# Patient Record
Sex: Male | Born: 1935 | Race: White | Hispanic: No | Marital: Married | Smoking: Never smoker
Health system: Southern US, Community
[De-identification: ages and names within clinical notes are randomized; demographics above are authoritative.]

## PROBLEM LIST (undated history)

## (undated) DIAGNOSIS — F0390 Unspecified dementia without behavioral disturbance: Secondary | ICD-10-CM

## (undated) DIAGNOSIS — C61 Malignant neoplasm of prostate: Secondary | ICD-10-CM

## (undated) HISTORY — PX: HIP SURGERY: SHX245

---

## 2013-10-26 ENCOUNTER — Emergency Department (HOSPITAL_COMMUNITY)
Admission: EM | Admit: 2013-10-26 | Discharge: 2013-10-26 | Disposition: A | Discharge status: Home / Self Care | Payer: Medicare Other | Attending: Emergency Medicine | Admitting: Emergency Medicine

## 2013-10-26 ENCOUNTER — Emergency Department (HOSPITAL_COMMUNITY): Payer: Medicare Other

## 2013-10-26 ENCOUNTER — Encounter (HOSPITAL_COMMUNITY): Payer: Self-pay | Admitting: Emergency Medicine

## 2013-10-26 DIAGNOSIS — Y929 Unspecified place or not applicable: Secondary | ICD-10-CM | POA: Insufficient documentation

## 2013-10-26 DIAGNOSIS — Y9301 Activity, walking, marching and hiking: Secondary | ICD-10-CM | POA: Insufficient documentation

## 2013-10-26 DIAGNOSIS — S0100XA Unspecified open wound of scalp, initial encounter: Secondary | ICD-10-CM | POA: Insufficient documentation

## 2013-10-26 DIAGNOSIS — W19XXXA Unspecified fall, initial encounter: Secondary | ICD-10-CM

## 2013-10-26 DIAGNOSIS — Z8546 Personal history of malignant neoplasm of prostate: Secondary | ICD-10-CM | POA: Insufficient documentation

## 2013-10-26 DIAGNOSIS — F0390 Unspecified dementia without behavioral disturbance: Secondary | ICD-10-CM | POA: Insufficient documentation

## 2013-10-26 DIAGNOSIS — S0990XA Unspecified injury of head, initial encounter: Secondary | ICD-10-CM | POA: Insufficient documentation

## 2013-10-26 DIAGNOSIS — Z23 Encounter for immunization: Secondary | ICD-10-CM | POA: Insufficient documentation

## 2013-10-26 DIAGNOSIS — W010XXA Fall on same level from slipping, tripping and stumbling without subsequent striking against object, initial encounter: Secondary | ICD-10-CM | POA: Insufficient documentation

## 2013-10-26 DIAGNOSIS — S0101XA Laceration without foreign body of scalp, initial encounter: Secondary | ICD-10-CM

## 2013-10-26 HISTORY — DX: Malignant neoplasm of prostate: C61

## 2013-10-26 HISTORY — DX: Unspecified dementia, unspecified severity, without behavioral disturbance, psychotic disturbance, mood disturbance, and anxiety: F03.90

## 2013-10-26 MED ORDER — TETANUS-DIPHTH-ACELL PERTUSSIS 5-2.5-18.5 LF-MCG/0.5 IM SUSP
0.5000 mL | Freq: Once | INTRAMUSCULAR | Status: AC
  Administered 2013-10-26: 0.5 mL via INTRAMUSCULAR
  Filled 2013-10-26: qty 0.5

## 2013-10-26 NOTE — Discharge Instructions (Signed)
Head Injury, Adult You have a head injury. Headaches and throwing up (vomiting) are common after a head injury. It should be easy to wake up from sleeping. Sometimes you must stay in the hospital. Most problems happen within the first 24 hours. Side effects may occur up to 7 10 days after the injury.  WHAT ARE THE TYPES OF HEAD INJURIES? Head injuries can be as minor as a bump. Some head injuries can be more severe. More severe head injuries include:  A jarring injury to the brain (concussion).  A bruise of the brain (contusion). This mean there is bleeding in the brain that can cause swelling.  A cracked skull (skull fracture).  Bleeding in the brain that collects, clots, and forms a bump (hematoma). . WHEN SHOULD I GET HELP RIGHT AWAY?   You are confused or sleepy.  You cannot be woken up.  You feel sick to your stomach (nauseous) or keep throwing up.  Your dizziness or unsteadiness is get worse.  You have very bad, lasting headaches that are not helped by medicine.  You cannot use your arms or legs like normal  You cannot walk.  You notice changes in the black spots in the center of the colored part of your eye (pupil).  You have clear or bloody fluid coming from your nose or ears.  You have trouble seeing. During the next 24 hours after the injury, you must stay with someone who can watch you. This person should get help right away (call 911 in the [place] if you start to shake and are not able to control it (seizures), you become pass out, or you are unable to wake up. HOW CAN I PREVENT A HEAD INJURY IN THE FUTURE?  Wear seat belts.  Wear helmets while bike riding and playing sports like football.  Stay away from dangerous activities around the house. WHEN CAN I RETURN TO NORMAL ACTIVITIES AND ATHLETICS? See your doctor before doing these activities. You should not do normal activities or play contact sports until 1 week after the following symptoms have  stopped:  Headache that does not go away.  Dizziness.  Poor attention.  Confusion.  Memory problems.  Sickness to your stomach or throwing up.  Tiredness.  Fussiness.  Bothered by bright lights or loud noises.  Anxiousness or depression.  Restless sleep. MAKE SURE YOU:   Understand these instructions.  Will watch your condition.  Will get help right away if you are not doing well or get worse. Document Released: 07/11/2008 Document Revised: 05/19/2013 Document Reviewed: 04/05/2013 [facility] Patient Information 2014 [place].  Laceration Care, Adult A laceration is a cut that goes through all layers of the skin. The cut goes into the tissue beneath the skin. HOME CARE For stitches (sutures) or staples:  Keep the cut clean and dry.  If you have a bandage (dressing), change it at least once a day. Change the bandage if it gets wet or dirty, or as told by your doctor.  Wash the cut with soap and water 2 times a day. Rinse the cut with water. Pat it dry with a clean towel.  Put a thin layer of medicated cream on the cut as told by your doctor.  You may shower after the first 24 hours. Do not soak the cut in water until the stitches are removed.  Only take medicines as told by your doctor.  Have your stitches or staples removed as told by your doctor. For skin adhesive strips:  Keep the cut clean and dry.  Do not get the strips wet. You may take a bath, but be careful to keep the cut dry.  If the cut gets wet, pat it dry with a clean towel.  The strips will fall off on their own. Do not remove the strips that are still stuck to the cut. For wound glue:  You may shower or take baths. Do not soak or scrub the cut. Do not swim. Avoid heavy sweating until the glue falls off on its own. After a shower or bath, pat the cut dry with a clean towel.  Do not put medicine on your cut until the glue falls off.  If you have a bandage, do not put tape over the  glue.  Avoid lots of sunlight or tanning lamps until the glue falls off. Put sunscreen on the cut for the first year to reduce your scar.  The glue will fall off on its own. Do not pick at the glue. You may need a tetanus shot if:  You cannot remember when you had your last tetanus shot.  You have never had a tetanus shot. If you need a tetanus shot and you choose not to have one, you may get tetanus. Sickness from tetanus can be serious. GET HELP RIGHT AWAY IF:   Your pain does not get better with medicine.  Your arm, hand, leg, or foot loses feeling (numbness) or changes color.  Your cut is bleeding.  Your joint feels weak, or you cannot use your joint.  You have painful lumps on your body.  Your cut is red, puffy (swollen), or painful.  You have a red line on the skin near the cut.  You have yellowish-white fluid (pus) coming from the cut.  You have a fever.  You have a bad smell coming from the cut or bandage.  Your cut breaks open before or after stitches are removed.  You notice something coming out of the cut, such as wood or glass.  You cannot move a finger or toe. MAKE SURE YOU:   Understand these instructions.  Will watch your condition.  Will get help right away if you are not doing well or get worse. Document Released: 01/15/2008 Document Revised: 10/21/2011 Document Reviewed: 01/22/2011 [facility] Patient Information 2014 [place].  Staple Wound Closure Staples are used to help a wound heal faster by holding the edges of the wound together. HOME CARE  Keep the area around the staples clean and dry.  Rest and raise (elevate) the injured part above the level of your heart.  See your doctor for a follow-up check of the wound.  See your doctor to have the staples removed.  Clean the wound daily with water.  Do not soak the wound in water for long periods of time.  Let air reach the wound as it heals. GET HELP RIGHT AWAY IF:   You have  redness or puffiness around the wound.  You have a red line going away from the wound.  You have more pain or tenderness.  You have yellowish-white fluid (pus) coming from the wound.  Your wound does not stay together after the staples have been taken out.  You see something coming out of the wound, such as wood or glass.  You have problems moving the injured area.  You have a fever or lasting symptoms for more than 2-3 days.  You have a fever and your symptoms suddenly get worse. MAKE SURE YOU:  Understand these instructions.  Will watch this condition.  Will get help right away if you are not doing well or get worse. Document Released: 05/07/2008 Document Revised: 04/22/2012 Document Reviewed: 02/09/2012 [facility] Patient Information 2014 [place].

## 2013-10-26 NOTE — ED Provider Notes (Signed)
CSN: [id number]     Arrival date & time 10/26/13  1930 History   First MD Initiated Contact with Patient 10/26/13 1951     Chief Complaint  Patient presents with  . Head Laceration     (Consider location/radiation/quality/duration/timing/severity/associated sxs/prior Treatment) Patient is a 80 y.o. male presenting with scalp laceration. The history is provided by the patient and a relative.  Head Laceration   The patient was seen walking with his walker, when he suddenly tumbled backwards, falling and struck his head on a cabinet. He injured his head in the fall. There was no loss of consciousness. Several hours later, his granddaughter arrived and thought that he should, here for evaluation. He came by private vehicle. He has not complained of any other injuries. There's been no vomiting, headache, chest pain, or noted weakness. The patient is unable to contribute to history.  Level V caveat- dementia  Past Medical History  Diagnosis Date  . Dementia   . Prostate cancer    Past Surgical History  Procedure Laterality Date  . Hip surgery     History reviewed. No pertinent family history. History  Substance Use Topics  . Smoking status: Never Smoker   . Smokeless tobacco: Not on file  . Alcohol Use: No    Review of Systems  All other systems reviewed and are negative.      Allergies  Review of patient's allergies indicates no known allergies.  Home Medications  No current outpatient prescriptions on file. BP 116/68  Pulse 93  Temp(Src) 97.6 F (36.4 C) (Oral)  Resp 18  Ht 6\' 1"  (1.854 m)  Wt 160 lb (72.576 kg)  BMI 21.11 kg/m2  SpO2 95% Physical Exam  Nursing note and vitals reviewed. Constitutional: He appears well-developed. No distress.  Elderly, frail. Cooperates with examination  HENT:  Head: Normocephalic and atraumatic.  Right Ear: External ear normal.  Left Ear: External ear normal.  Vertical laceration, left parietal without associated  crepitation, deformity or active bleeding.  Eyes: Conjunctivae and EOM are normal. Pupils are equal, round, and reactive to light.  Neck: Normal range of motion and phonation normal. Neck supple.  Cardiovascular: Normal rate, regular rhythm, normal heart sounds and intact distal pulses.   Pulmonary/Chest: Effort normal and breath sounds normal. He exhibits no bony tenderness.  No chest wall tenderness, crepitation, or deformity  Abdominal: Soft. Normal appearance. There is no tenderness.  Musculoskeletal: Normal range of motion.  No tenderness to palpation of the cervical, thoracic, or lumbar spines.  Neurological: He is alert. No cranial nerve deficit or sensory deficit. He exhibits normal muscle tone. Coordination normal.  Skin: Skin is warm, dry and intact.  Psychiatric: He has a normal mood and affect. His behavior is normal.    ED Course  Procedures (including critical care time)  Medications  Tdap (BOOSTRIX) injection 0.5 mL (0.5 mLs Intramuscular Given 10/26/13 2019)    Patient Vitals for the past 24 hrs:  BP Temp Temp src Pulse Resp SpO2 Height Weight  10/26/13 1940 116/68 mmHg 97.6 F (36.4 C) Oral 93 18 95 % 6\' 1"  (1.854 m) 160 lb (72.576 kg)   LACERATION REPAIR Performed by: Richarda Blade Consent: Verbal consent obtained. Risks and benefits: risks, benefits and alternatives were discussed Patient identity confirmed: provided demographic data Time out performed prior to procedure Prepped and Draped in normal sterile fashion Wound explored Laceration Location: left parietal Laceration Length: 3.5cm No Foreign Bodies seen or palpated Anesthesia: local infiltration  Irrigation method: Saline  on gauze Amount of cleaning: standard Skin closure: Staples Number of sutures or staples: 3 Technique: regular Patient tolerance: Patient tolerated the procedure well with no immediate complications.   8:10 PM Reevaluation with update and discussion. After initial assessment  and treatment, an updated evaluation reveals No change in PE. Tolerated wound closure well. Findings discussed with son, questions answered. Takuya Lariccia L      MDM   Final diagnoses:  Fall  Laceration of scalp  Head injury    Fall related to gait instability. There is no apparent causative factor for the fall, such as acute illness, focal neurologic weakness, or suspected orthostasis.  Nursing Notes Reviewed/ Care Coordinated Applicable Imaging Reviewed Interpretation of Laboratory Data incorporated into ED treatment  The patient appears reasonably screened and/or stabilized for discharge and I doubt any other medical condition or other [facility] requiring further screening, evaluation, or treatment in the ED at this time prior to discharge.  Plan: Home Medications- usual; Home Treatments- rest, wound care; return here if the recommended treatment, does not improve the symptoms; Recommended follow up- Staples out 7-10 days, return here prn     Richarda Blade, MD 10/26/13 2142

## 2013-10-26 NOTE — ED Notes (Signed)
Patient states he was ambulating with his walker today about 1500 and "I got a little dizzy and fell." Patient has approximately 1" laceration to back of head. Denies LOC. Bleeding controlled at this time.

## 2014-03-25 ENCOUNTER — Other Ambulatory Visit (HOSPITAL_COMMUNITY): Payer: Self-pay | Admitting: Urology

## 2014-03-25 DIAGNOSIS — C61 Malignant neoplasm of prostate: Secondary | ICD-10-CM

## 2014-04-01 ENCOUNTER — Encounter (HOSPITAL_COMMUNITY): Payer: Self-pay

## 2014-04-01 ENCOUNTER — Encounter (HOSPITAL_COMMUNITY)
Admission: RE | Admit: 2014-04-01 | Discharge: 2014-04-01 | Disposition: A | Discharge status: Home / Self Care | Payer: Medicare Other | Source: Ambulatory Visit | Attending: Urology | Admitting: Urology

## 2014-04-01 DIAGNOSIS — C61 Malignant neoplasm of prostate: Secondary | ICD-10-CM | POA: Insufficient documentation

## 2014-04-01 MED ORDER — TECHNETIUM TC 99M MEDRONATE IV KIT
25.0000 | PACK | Freq: Once | INTRAVENOUS | Status: AC | PRN
  Administered 2014-04-01: 25 via INTRAVENOUS

## 2014-04-19 ENCOUNTER — Ambulatory Visit (HOSPITAL_COMMUNITY)
Admission: RE | Admit: 2014-04-19 | Discharge: 2014-04-19 | Disposition: A | Discharge status: Home / Self Care | Payer: Medicare Other | Source: Ambulatory Visit | Attending: Urology | Admitting: Urology

## 2014-04-19 ENCOUNTER — Other Ambulatory Visit: Payer: Self-pay | Admitting: Urology

## 2014-04-19 ENCOUNTER — Ambulatory Visit (INDEPENDENT_AMBULATORY_CARE_PROVIDER_SITE_OTHER): Payer: Medicare Other | Admitting: Urology

## 2014-04-19 DIAGNOSIS — R05 Cough: Secondary | ICD-10-CM | POA: Diagnosis not present

## 2014-04-19 DIAGNOSIS — C7952 Secondary malignant neoplasm of bone marrow: Secondary | ICD-10-CM

## 2014-04-19 DIAGNOSIS — R0602 Shortness of breath: Secondary | ICD-10-CM | POA: Insufficient documentation

## 2014-04-19 DIAGNOSIS — C7951 Secondary malignant neoplasm of bone: Secondary | ICD-10-CM

## 2014-04-19 DIAGNOSIS — R059 Cough, unspecified: Secondary | ICD-10-CM | POA: Diagnosis not present

## 2014-04-19 DIAGNOSIS — R918 Other nonspecific abnormal finding of lung field: Secondary | ICD-10-CM | POA: Diagnosis not present

## 2014-04-19 DIAGNOSIS — C61 Malignant neoplasm of prostate: Secondary | ICD-10-CM

## 2014-08-12 DEATH — deceased

## 2015-09-16 IMAGING — CR DG CHEST 2V
2 series · 2 of 2 positions shown · non-contrast
Comparison: None.

CLINICAL DATA: Shortness of breath and cough.

EXAM:
CHEST  2 VIEW

[view not recorded (1 of 2)]
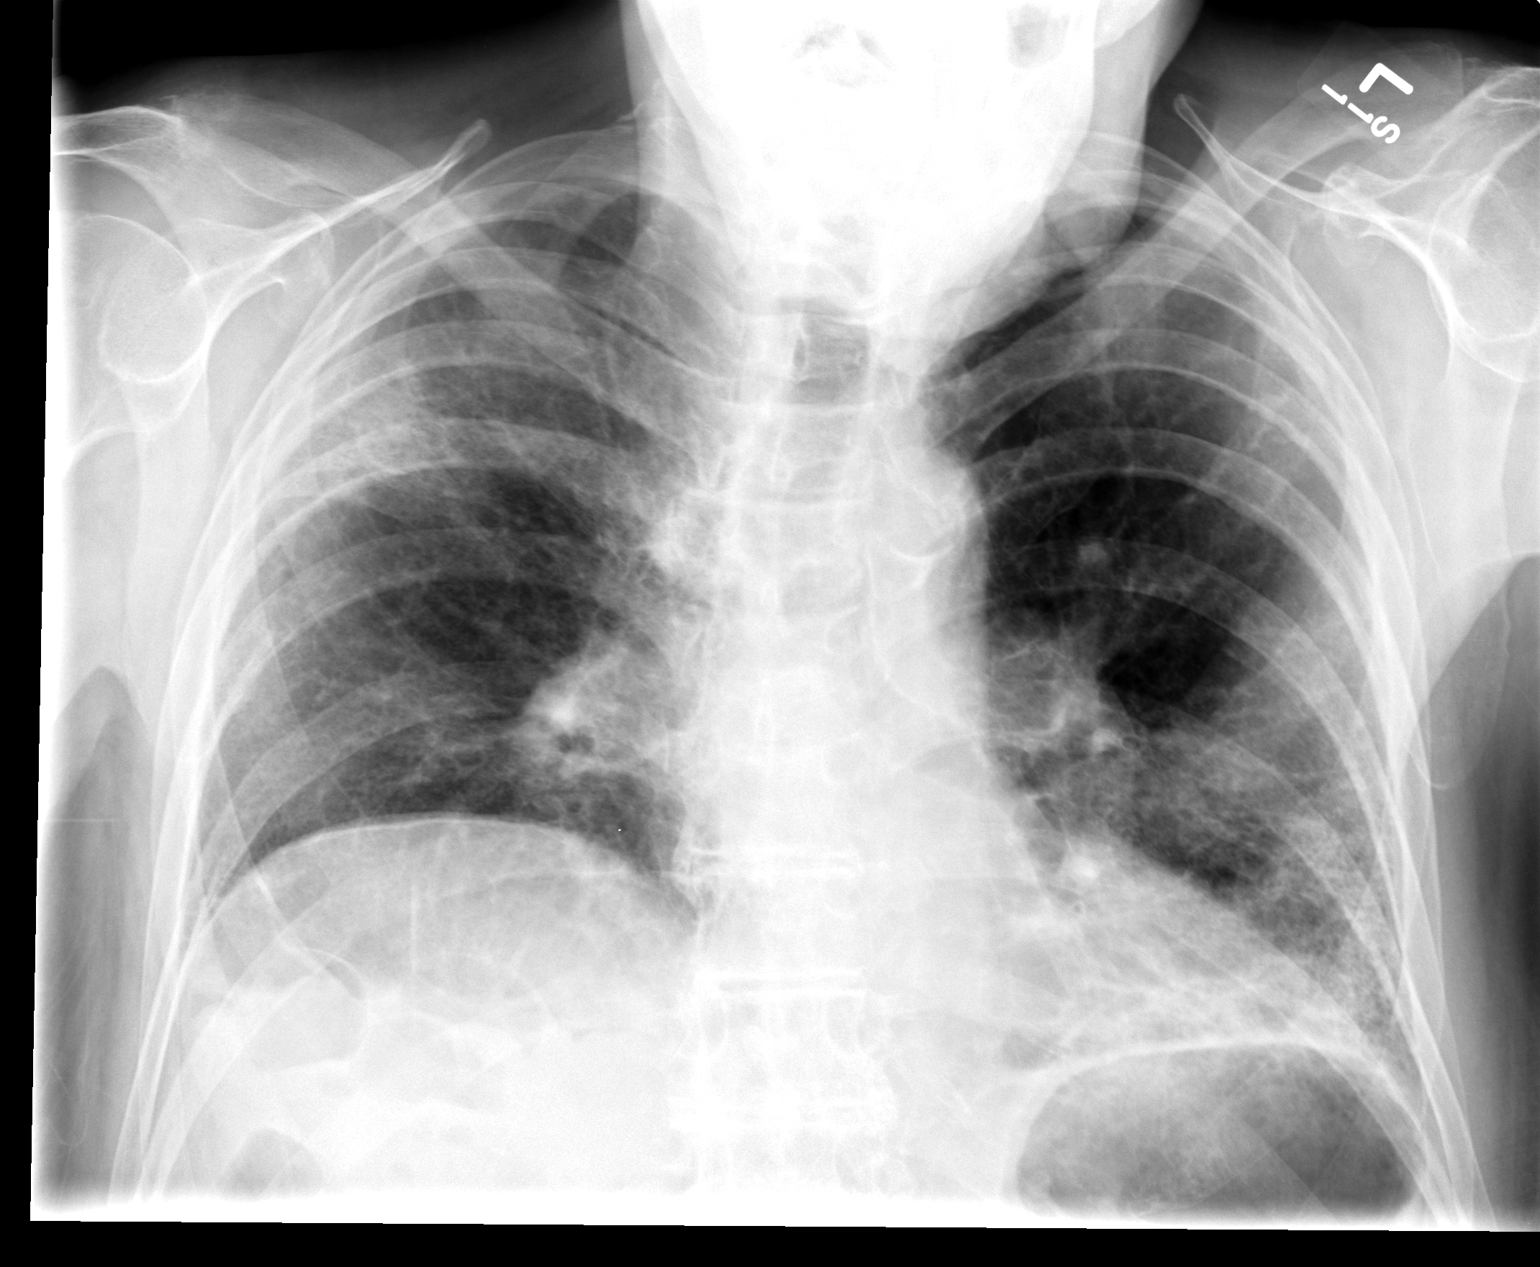

[view not recorded (2 of 2)]
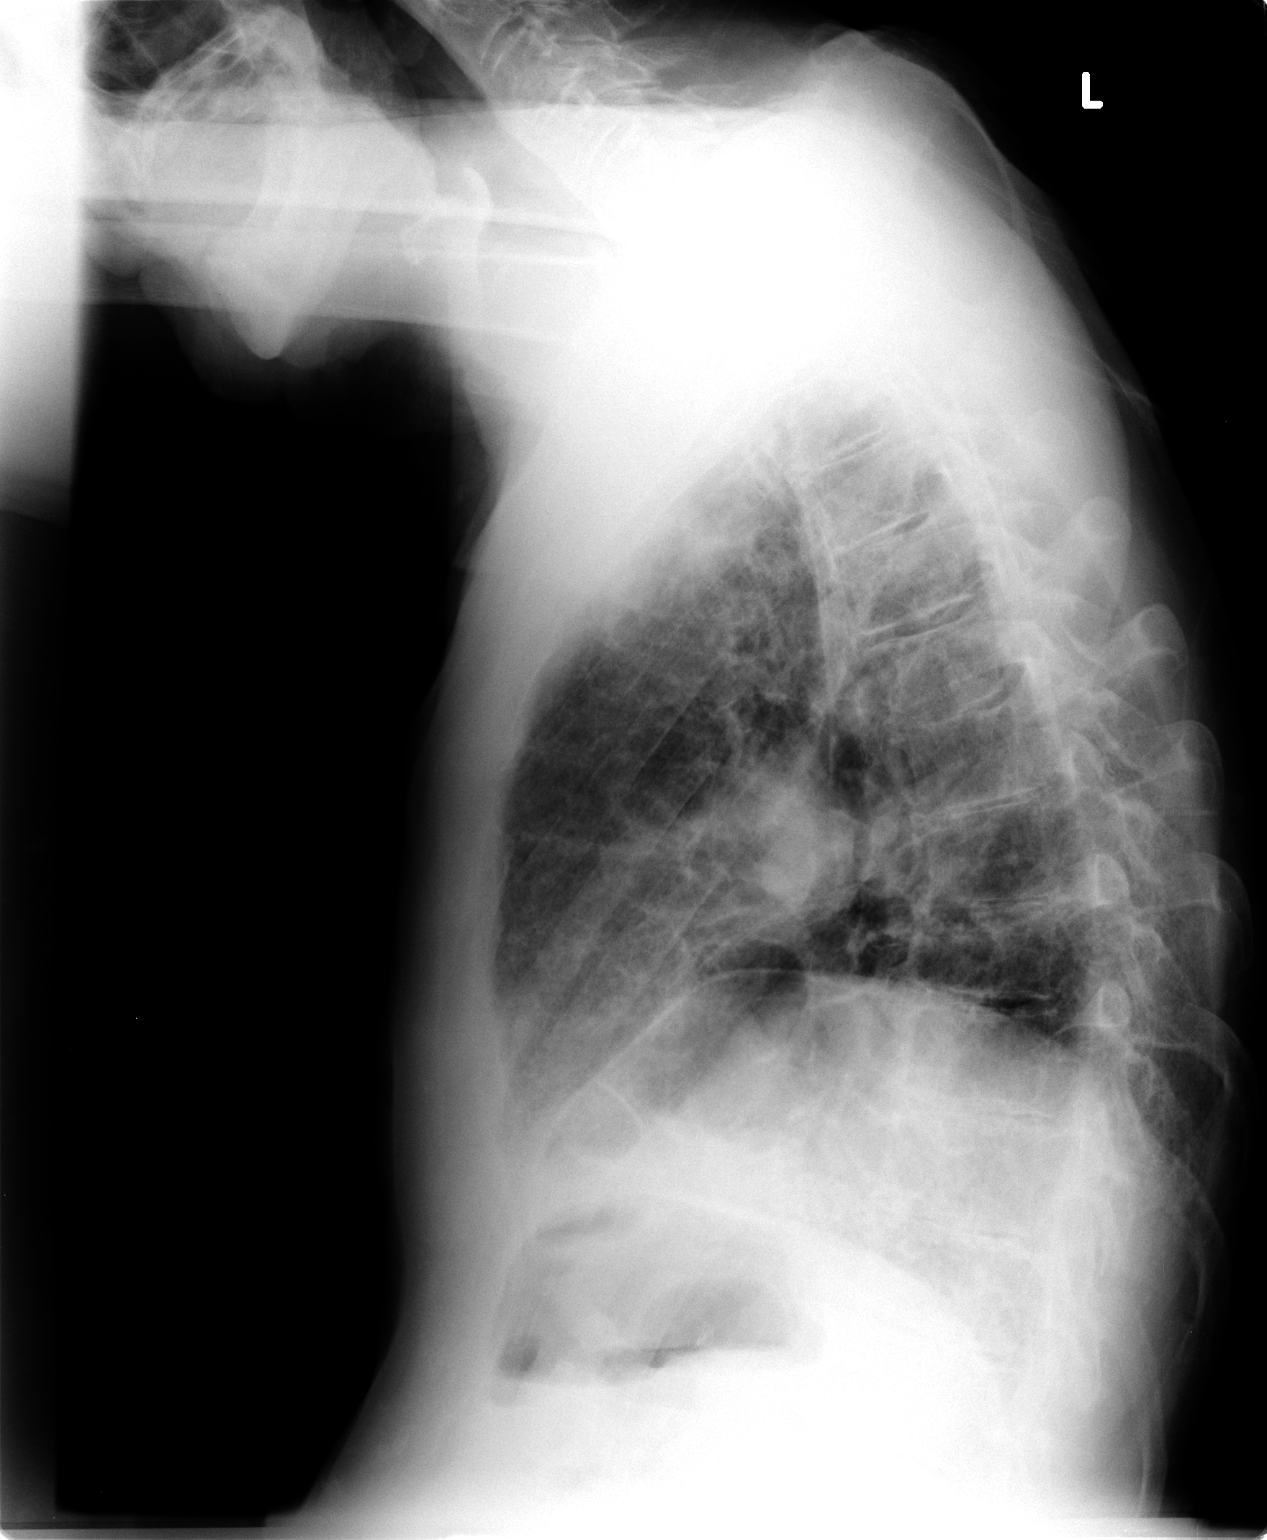

[2 of 2 positions shown; findings below may reference images not displayed]

FINDINGS: Mediastinum and hilar structures normal. Right upper lobe, right
lower lobe, and left lower lobe infiltrates consistent with
pneumonia noted. No pleural effusion or pneumothorax. Cardiomegaly,
normal pulmonary vascularity. No acute bony abnormality.
Interposition of colon under the right hemidiaphragm.
IMPRESSION: Bilateral multifocal infiltrates consistent with pneumonia.
